# Patient Record
Sex: Female | Born: 1995 | Race: Black or African American | Hispanic: No | Marital: Single | Smoking: Never smoker
Health system: Southern US, Community
[De-identification: ages and names within clinical notes are randomized; demographics above are authoritative.]

## PROBLEM LIST (undated history)

## (undated) DIAGNOSIS — T7840XA Allergy, unspecified, initial encounter: Secondary | ICD-10-CM

## (undated) HISTORY — DX: Allergy, unspecified, initial encounter: T78.40XA

---

## 2018-04-18 ENCOUNTER — Other Ambulatory Visit: Payer: Self-pay

## 2018-04-18 ENCOUNTER — Encounter: Payer: Self-pay | Admitting: Emergency Medicine

## 2018-04-18 DIAGNOSIS — R509 Fever, unspecified: Secondary | ICD-10-CM | POA: Diagnosis present

## 2018-04-18 DIAGNOSIS — R110 Nausea: Secondary | ICD-10-CM | POA: Insufficient documentation

## 2018-04-18 DIAGNOSIS — R5383 Other fatigue: Secondary | ICD-10-CM | POA: Diagnosis not present

## 2018-04-18 DIAGNOSIS — R42 Dizziness and giddiness: Secondary | ICD-10-CM | POA: Insufficient documentation

## 2018-04-18 DIAGNOSIS — R5381 Other malaise: Secondary | ICD-10-CM | POA: Diagnosis not present

## 2018-04-18 DIAGNOSIS — R21 Rash and other nonspecific skin eruption: Secondary | ICD-10-CM | POA: Insufficient documentation

## 2018-04-18 DIAGNOSIS — R197 Diarrhea, unspecified: Secondary | ICD-10-CM | POA: Insufficient documentation

## 2018-04-18 DIAGNOSIS — D709 Neutropenia, unspecified: Secondary | ICD-10-CM | POA: Diagnosis not present

## 2018-04-18 LAB — URINALYSIS, COMPLETE (UACMP) WITH MICROSCOPIC
BILIRUBIN URINE: NEGATIVE
Glucose, UA: NEGATIVE mg/dL
Hgb urine dipstick: NEGATIVE
Ketones, ur: NEGATIVE mg/dL
LEUKOCYTES UA: NEGATIVE
Nitrite: NEGATIVE
PH: 6 (ref 5.0–8.0)
Protein, ur: NEGATIVE mg/dL
Specific Gravity, Urine: 1.009 (ref 1.005–1.030)

## 2018-04-18 LAB — CBC WITH DIFFERENTIAL/PLATELET
Basophils Absolute: 0 10*3/uL (ref 0–0.1)
Basophils Relative: 1 %
EOS ABS: 0.1 10*3/uL (ref 0–0.7)
Eosinophils Relative: 4 %
HCT: 34.5 % — ABNORMAL LOW (ref 35.0–47.0)
Hemoglobin: 11.7 g/dL — ABNORMAL LOW (ref 12.0–16.0)
LYMPHS ABS: 0.8 10*3/uL — AB (ref 1.0–3.6)
Lymphocytes Relative: 44 %
MCH: 30.4 pg (ref 26.0–34.0)
MCHC: 34 g/dL (ref 32.0–36.0)
MCV: 89.6 fL (ref 80.0–100.0)
MONO ABS: 0.3 10*3/uL (ref 0.2–0.9)
Monocytes Relative: 17 %
Neutro Abs: 0.6 10*3/uL — ABNORMAL LOW (ref 1.4–6.5)
Neutrophils Relative %: 34 %
PLATELETS: 196 10*3/uL (ref 150–440)
RBC: 3.85 MIL/uL (ref 3.80–5.20)
RDW: 13.5 % (ref 11.5–14.5)
WBC: 1.9 10*3/uL — AB (ref 3.6–11.0)

## 2018-04-18 LAB — COMPREHENSIVE METABOLIC PANEL
ALT: 45 U/L (ref 14–54)
AST: 44 U/L — AB (ref 15–41)
Albumin: 3.8 g/dL (ref 3.5–5.0)
Alkaline Phosphatase: 45 U/L (ref 38–126)
Anion gap: 9 (ref 5–15)
BUN: 7 mg/dL (ref 6–20)
CHLORIDE: 105 mmol/L (ref 101–111)
CO2: 23 mmol/L (ref 22–32)
CREATININE: 0.59 mg/dL (ref 0.44–1.00)
Calcium: 8.5 mg/dL — ABNORMAL LOW (ref 8.9–10.3)
GFR calc Af Amer: >60 mL/min (ref >60)
GFR calc non Af Amer: >60 mL/min (ref >60)
Glucose, Bld: 119 mg/dL — ABNORMAL HIGH (ref 65–99)
Potassium: 3.6 mmol/L (ref 3.5–5.1)
SODIUM: 137 mmol/L (ref 135–145)
Total Bilirubin: 0.5 mg/dL (ref 0.3–1.2)
Total Protein: 6.8 g/dL (ref 6.5–8.1)

## 2018-04-18 LAB — LIPASE, BLOOD: LIPASE: 34 U/L (ref 11–51)

## 2018-04-18 LAB — TROPONIN I: Troponin I: <0.03 ng/mL (ref <0.03)

## 2018-04-18 LAB — POCT PREGNANCY, URINE: Preg Test, Ur: NEGATIVE

## 2018-04-18 NOTE — ED Triage Notes (Addendum)
Pt to triage via w/c, tearful, appears anxious; pt with multiple c/o; st began as possible allergic rx to greek yogurt on Thurs or Fri; st she knows because she "googled it" and had all the symptoms (diarrhea, hives, itching, throat pain, lower abd pain); pt reports dizziness persists and a "soreness" to the right side of her neck when she presses on it

## 2018-04-19 ENCOUNTER — Emergency Department: Payer: Managed Care, Other (non HMO)

## 2018-04-19 ENCOUNTER — Emergency Department
Admission: EM | Admit: 2018-04-19 | Discharge: 2018-04-19 | Disposition: A | Discharge status: Home / Self Care | Payer: Managed Care, Other (non HMO) | Attending: Emergency Medicine | Admitting: Emergency Medicine

## 2018-04-19 DIAGNOSIS — R5381 Other malaise: Secondary | ICD-10-CM

## 2018-04-19 DIAGNOSIS — R5383 Other fatigue: Secondary | ICD-10-CM

## 2018-04-19 DIAGNOSIS — D709 Neutropenia, unspecified: Secondary | ICD-10-CM

## 2018-04-19 MED ORDER — DOXYCYCLINE HYCLATE 100 MG PO CAPS: ORAL_CAPSULE | ORAL | 0 refills | Status: AC

## 2018-04-19 NOTE — Discharge Instructions (Addendum)
As we discussed, your symptoms and low white blood cell count most likely are the result of a viral illness or possibly a tickborne infection.  There is no reason to think at this point you have any other or worse medical condition.  It is important, however, that you follow-up as an outpatient with a hematologist who will likely do some repeat blood work and possibly some additional testing to make sure that your blood count is returning to normal.  Please read through the included information for additional details about neutropenia.    As we also discussed, we are sending blood cultures and tests for various tickborne illnesses, but we are going to go ahead and treat you for the possibility of tickborne illness with antibiotics called doxycycline.  Please take the antibiotics twice daily until the medication is gone, and less Dr. Donneta RombergBrahmanday or one of his colleagues tells you next week when you follow-up that you should stop taking them.  In the meantime drink plenty of fluids to stay hydrated and get as much rest as you can.  If you develop any new or worsening symptoms that concern you, including but not limited to fever of 100.4 or greater, persistent vomiting, severe pain, etc., please return immediately to the emergency department.

## 2018-04-19 NOTE — ED Provider Notes (Addendum)
[facility] Emergency Department Provider Note  ____________________________________________   First MD Initiated Contact with Patient 04/19/18 0132     (approximate)  I have reviewed the triage vital signs and the nursing notes.   HISTORY  Chief Complaint No chief complaint on file.    HPI Wendy Carreto is a 22 y.o. female with no chronic medical issues who presents for evaluation of about 5 days of generalized symptoms that includes fever and general malaise, body aches, some nausea, several episodes of loose stools, and nonspecific rash that she believes is hives, and some discomfort inside of her neck.  She had some dizziness or lightheadedness over the last couple of days as well.  She believes that the symptoms started after eating some Greek yogurt about 5 days ago just prior to the onset of the symptoms.  Benadryl made the rash go away and stop itching, but nothing else in particular has made her feel better or worse.  She denies chest pain, shortness of breath, cough, neck pain, neck stiffness.  She has had some subjective fever but she has not measured her temperature.  No chills/rigors.  No abdominal pain nor dysuria.  She describes the symptoms moderate and worse.  History reviewed. No pertinent past medical history.  There are no active problems to display for this patient.   History reviewed. No pertinent surgical history.  Prior to Admission medications   Medication Sig Start Date End Date Taking? Authorizing Provider  doxycycline (VIBRAMYCIN) 100 MG capsule Take 1 capsule (100 mg) by mouth twice daily for 10 days. 04/19/18   Loleta Rose, MD    Allergies Patient has no known allergies.  No family history on file.  Social History Social History   Tobacco Use  . Smoking status: Never Smoker  . Smokeless tobacco: Never Used  Substance Use Topics  . Alcohol use: Not on file  . Drug use: Not on file    Review of  Systems Constitutional: Subjective fever. General malaise/fatigue. Eyes: No visual changes. ENT: No sore throat. Some tenderness in the right side of her neck, no swelling. Cardiovascular: Denies chest pain. Respiratory: Denies shortness of breath. Gastrointestinal: No abdominal pain.  Nausea, no vomiting.  Some mild diarrhea.  No constipation. Genitourinary: Negative for dysuria. Musculoskeletal: Mild right sided neck tenderness, no swelling.  Negative for back pain. Integumentary: Brief episode of generalized rash all over body that was red and raised and pruritic but which resolved after Benadryl Neurological: Negative for headaches, focal weakness or numbness.  Lightheadedness.   ____________________________________________   PHYSICAL EXAM:  VITAL SIGNS: ED Triage Vitals  Enc Vitals Group     BP 04/18/18 2108 137/69     Pulse Rate 04/18/18 2108 90     Resp 04/18/18 2108 20     Temp 04/18/18 2108 98.2 F (36.8 C)     Temp Source 04/18/18 2108 Oral     SpO2 04/18/18 2108 100 %     Weight 04/18/18 2106 56.7 kg (125 lb)     Height 04/18/18 2106 1.651 m (5\' 5" )     Head Circumference --      Peak Flow --      Pain Score 04/18/18 2106 0     Pain Loc --      Pain Edu? --      Excl. in GC? --     Constitutional: Alert and oriented. Well appearing and in no acute distress. Eyes: Conjunctivae are normal.  Head: Atraumatic. Nose:  No congestion/rhinnorhea. Mouth/Throat: Mucous membranes are moist.  Oropharynx non-erythematous. Neck: No stridor.  No meningeal signs.  No palpable lymphadenopathy nor induration. Cardiovascular: Normal rate, regular rhythm. Good peripheral circulation. Grossly normal heart sounds. Respiratory: Normal respiratory effort.  No retractions. Lungs CTAB. Gastrointestinal: Soft and nontender. No distention.  Musculoskeletal: No lower extremity tenderness nor edema. No gross deformities of extremities. Neurologic:  Normal speech and language. No gross  focal neurologic deficits are appreciated.  Skin:  Skin is warm, dry and intact. No rash noted. Psychiatric: Mood and affect are Anxious and at times tearful, but not inappropriate. Speech and behavior are otherwise normal.  ____________________________________________   LABS (all labs ordered are listed, but only abnormal results are displayed)  Labs Reviewed  COMPREHENSIVE METABOLIC PANEL - Abnormal; Notable for the following components:      Result Value   Glucose, Bld 119 (*)    Calcium 8.5 (*)    AST 44 (*)    All other components within normal limits  URINALYSIS, COMPLETE (UACMP) WITH MICROSCOPIC - Abnormal; Notable for the following components:   Color, Urine YELLOW (*)    APPearance CLEAR (*)    Bacteria, UA RARE (*)    All other components within normal limits  CBC WITH DIFFERENTIAL/PLATELET - Abnormal; Notable for the following components:   WBC 1.9 (*)    Hemoglobin 11.7 (*)    HCT 34.5 (*)    Neutro Abs 0.6 (*)    Lymphs Abs 0.8 (*)    All other components within normal limits  CULTURE, BLOOD (ROUTINE X 2)  CULTURE, BLOOD (ROUTINE X 2)  TROPONIN I  LIPASE, BLOOD  CBC WITH DIFFERENTIAL/PLATELET  ROCKY MTN SPOTTED FVR ABS PNL(IGG+IGM)  B. BURGDORFI ANTIBODIES  POCT PREGNANCY, URINE   ____________________________________________  EKG  ED ECG REPORT I, Loleta Roseory Glennda Weatherholtz, the attending physician, personally viewed and interpreted this ECG.  Date: 04/18/2018 EKG Time: 21: 09 Rate: 91 Rhythm: normal sinus rhythm with mild sinus arrhythmia QRS Axis: normal Intervals: normal ST/T Wave abnormalities: normal Narrative Interpretation: no evidence of acute ischemia  ____________________________________________  RADIOLOGY I, Loleta Roseory Rocio Wolak, personally viewed and evaluated these images (plain radiographs) as part of my medical decision making, as well as reviewing the written report by the radiologist.   ED MD interpretation:  Question bronchitis, no evidence of  pneumonia according to radiology.  No contraindication to Doxy which may actually help, no evidence of lobar pneumonia or mass  Official radiology report(s): Dg Chest 2 View  Result Date: 04/19/2018 CLINICAL DATA:  Neutropenia.  Subjective fever. EXAM: CHEST - 2 VIEW COMPARISON:  None. FINDINGS: The cardiomediastinal contours are normal. Mild bronchial thickening. Pulmonary vasculature is normal. No consolidation, pleural effusion, or pneumothorax. No acute osseous abnormalities are seen. IMPRESSION: Mild bronchial thickening which may reflect bronchitis. No pneumonia. Electronically Signed   By: Rubye OaksMelanie  Ehinger M.D.   On: 04/19/2018 02:58    ____________________________________________   PROCEDURES  Critical Care performed: No   Procedure(s) performed:   Procedures   ____________________________________________   INITIAL IMPRESSION / ASSESSMENT AND PLAN / ED COURSE  As part of my medical decision making, I reviewed the following data within the electronic MEDICAL RECORD NUMBER Nursing notes reviewed and incorporated, Labs reviewed  and A phone consult was requested and obtained from this/these consultant(s) (heme/onc).    Differential diagnosis includes, but is not limited to, viral illness, tickborne illness, nonspecific infection, oncological issues such as leukemia or lymphoma.  However the patient is very well-appearing with normal  vital signs, afebrile, and her only lab abnormality after full review of her lab work is the leukopenia/neutropenia.  I verified with the lab that she has no visible bands and that her ANC is 646 (or 0.6 as it is reported).  I called and spoke with phone with Dr. Donneta Romberg.  We discussed the patient in the differential diagnosis.  I asked if there is any benefit or if he would recommend admitting the patient to the hospital and given that she is well-appearing with no evidence of sepsis or active infection and is afebrile, he felt that following up as an  outpatient would be a opiate.  I read the possibility of empiric doxycycline given the chance of tickborne illness; she has leukopenia and a very slightly elevated AST but no thrombocytopenia.  He feels that this is appropriate treatment at this time.  I am checking a chest x-ray to look for any evidence of pneumonia or other acute thoracic abnormality, sending blood cultures, and sending both Lyme disease and RMSF tests.    I explained to the patient the differential and that we think it is most likely viral, why we are giving the antibiotics, and plan is to follow-up.  I also gave her return options including but not limited to fever.  She understands and agrees with the plan.  Clinical Course as of Apr 19 306  Wed Apr 19, 2018  0305 Question bronchitis, no evidence of pneumonia according to radiology.  No contraindication to Doxy which may actually help, no evidence of lobar pneumonia or mass.  DG Chest 2 View [CF]    Clinical Course User Index [CF] Loleta Rose, MD    ____________________________________________  FINAL CLINICAL IMPRESSION(S) / ED DIAGNOSES  Final diagnoses:  Neutropenia, unspecified type (HCC)  Malaise and fatigue     MEDICATIONS GIVEN DURING THIS VISIT:  Medications - No data to display   ED Discharge Orders        Ordered    doxycycline (VIBRAMYCIN) 100 MG capsule     04/19/18 0305       Note:  This document was prepared using Dragon voice recognition software and may include unintentional dictation errors.       Loleta Rose, MD 04/19/18 [phone]

## 2018-04-21 LAB — B. BURGDORFI ANTIBODIES: B burgdorferi Ab IgG+IgM: <0.91 {ISR} (ref 0.00–0.90)

## 2018-04-21 LAB — ROCKY MTN SPOTTED FVR ABS PNL(IGG+IGM)
RMSF IGG: NEGATIVE
RMSF IgM: 0.46 index (ref 0.00–0.89)

## 2018-04-24 LAB — CULTURE, BLOOD (ROUTINE X 2)
Culture: NO GROWTH
Culture: NO GROWTH
Special Requests: ADEQUATE

## 2018-04-25 ENCOUNTER — Encounter: Payer: Self-pay | Admitting: Oncology

## 2018-04-25 ENCOUNTER — Inpatient Hospital Stay: Payer: Managed Care, Other (non HMO)

## 2018-04-25 ENCOUNTER — Inpatient Hospital Stay: Payer: Managed Care, Other (non HMO) | Attending: Oncology | Admitting: Oncology

## 2018-04-25 VITALS — BP 98/65 | HR 75 | Temp 97.5°F | Resp 16 | Wt 123.0 lb

## 2018-04-25 DIAGNOSIS — D72810 Lymphocytopenia: Secondary | ICD-10-CM | POA: Diagnosis not present

## 2018-04-25 DIAGNOSIS — D709 Neutropenia, unspecified: Secondary | ICD-10-CM | POA: Insufficient documentation

## 2018-04-25 LAB — CBC WITH DIFFERENTIAL/PLATELET
Basophils Absolute: 0 10*3/uL (ref 0–0.1)
Basophils Relative: 1 %
EOS PCT: 3 %
Eosinophils Absolute: 0.1 10*3/uL (ref 0–0.7)
HCT: 36.3 % (ref 35.0–47.0)
Hemoglobin: 12.3 g/dL (ref 12.0–16.0)
LYMPHS ABS: 1.5 10*3/uL (ref 1.0–3.6)
LYMPHS PCT: 38 %
MCH: 31.2 pg (ref 26.0–34.0)
MCHC: 34 g/dL (ref 32.0–36.0)
MCV: 91.9 fL (ref 80.0–100.0)
MONOS PCT: 12 %
Monocytes Absolute: 0.5 10*3/uL (ref 0.2–0.9)
Neutro Abs: 1.8 10*3/uL (ref 1.4–6.5)
Neutrophils Relative %: 46 %
PLATELETS: 473 10*3/uL — AB (ref 150–440)
RBC: 3.95 MIL/uL (ref 3.80–5.20)
RDW: 13.5 % (ref 11.5–14.5)
WBC: 3.9 10*3/uL (ref 3.6–11.0)

## 2018-04-25 LAB — FOLATE: FOLATE: 12.9 ng/mL (ref >5.9)

## 2018-04-25 LAB — IRON AND TIBC
IRON: 37 ug/dL (ref 28–170)
SATURATION RATIOS: 11 % (ref 10.4–31.8)
TIBC: 353 ug/dL (ref 250–450)
UIBC: 316 ug/dL

## 2018-04-25 LAB — LACTATE DEHYDROGENASE: LDH: 219 U/L — ABNORMAL HIGH (ref 98–192)

## 2018-04-25 LAB — VITAMIN B12: Vitamin B-12: 471 pg/mL (ref 180–914)

## 2018-04-25 LAB — FERRITIN: FERRITIN: 46 ng/mL (ref 11–307)

## 2018-04-25 NOTE — Progress Notes (Addendum)
Hematology/Oncology Consult note [facility] [phone] [phone]   Patient Care Team: System, Pcp Not In as PCP - General  REFERRING PROVIDER: Emergency room CHIEF COMPLAINTS/REASON FOR VISIT:  Evaluation of neutropenia  HISTORY OF PRESENTING ILLNESS:  Wendy Melendez is a  22 y.o.  female with PMH listed below who was referred to me for evaluation of neutropenia.  Patient has no chronic medications presents to emergency room for about 5 days of generalized symptoms that include fever and generalized body aches, nausea, loose stools and nonspecific rash.  She felt dizziness.  She took Benadryl which improved the rash and stop itching.  Lab work-up showed neutropenia at 0.6, lymphocytopenia 0.8.  Total white count 1.9.  Hemoglobin 11.7. It was thought that patient may have a tickborne disease the patient was discharged with empiric doxycycline.  Patient reports she is feeling better.  Denies any episode of fever or chills, additional episodes of nausea vomiting diarrhea.  Work-up for tickborne disease is negative  Review of Systems  Constitutional: Negative for chills, fever, malaise/fatigue and weight loss.  HENT: Negative for congestion, ear discharge, ear pain, nosebleeds, sinus pain and sore throat.   Eyes: Negative for double vision, photophobia, pain, discharge and redness.  Respiratory: Negative for cough, hemoptysis, sputum production, shortness of breath and wheezing.   Cardiovascular: Negative for chest pain, palpitations, orthopnea, claudication and leg swelling.  Gastrointestinal: Negative for abdominal pain, blood in stool, constipation, diarrhea, heartburn, melena, nausea and vomiting.  Genitourinary: Negative for dysuria, flank pain, frequency and hematuria.  Musculoskeletal: Negative for back pain, myalgias and neck pain.  Skin: Negative for itching and rash.  Neurological: Negative for dizziness, tingling, tremors, focal  weakness, weakness and headaches.  Endo/Heme/Allergies: Negative for environmental allergies. Does not bruise/bleed easily.  Psychiatric/Behavioral: Negative for depression and hallucinations. The patient is not nervous/anxious.     MEDICAL HISTORY:  Past Medical History:  Diagnosis Date  . Allergy     SURGICAL HISTORY: History reviewed. No pertinent surgical history.  SOCIAL HISTORY: Social History   Socioeconomic History  . Marital status: Single    Spouse name: Not on file  . Number of children: Not on file  . Years of education: Not on file  . Highest education level: Not on file  Occupational History  . Not on file  Social Needs  . Financial resource strain: Not on file  . Food insecurity:    Worry: Not on file    Inability: Not on file  . Transportation needs:    Medical: Not on file    Non-medical: Not on file  Tobacco Use  . Smoking status: Never Smoker  . Smokeless tobacco: Never Used  Substance and Sexual Activity  . Alcohol use: Never    Frequency: Never  . Drug use: Yes    Types: Marijuana  . Sexual activity: Not on file  Lifestyle  . Physical activity:    Days per week: Not on file    Minutes per session: Not on file  . Stress: Not on file  Relationships  . Social connections:    Talks on phone: Not on file    Gets together: Not on file    Attends religious service: Not on file    Active member of club or organization: Not on file    Attends meetings of clubs or organizations: Not on file    Relationship status: Not on file  . Intimate partner violence:    Fear of current or ex  partner: Not on file    Emotionally abused: Not on file    Physically abused: Not on file    Forced sexual activity: Not on file  Other Topics Concern  . Not on file  Social History Narrative  . Not on file    FAMILY HISTORY: History reviewed. No pertinent family history.  ALLERGIES:  has No Known Allergies.  MEDICATIONS:  Current Outpatient Medications    Medication Sig Dispense Refill  . doxycycline (VIBRAMYCIN) 100 MG capsule Take 1 capsule (100 mg) by mouth twice daily for 10 days. 20 capsule 0  . montelukast (SINGULAIR) 10 MG tablet Take 10 mg by mouth at bedtime.     No current facility-administered medications for this visit.      PHYSICAL EXAMINATION: ECOG PERFORMANCE STATUS: 0 - Asymptomatic Vitals:   04/25/18 1505  BP: 98/65  Pulse: 75  Resp: 16  Temp: (!) 97.5 F (36.4 C)   Filed Weights   04/25/18 1505  Weight: 123 lb (55.8 kg)    Physical Exam  Constitutional: She is oriented to person, place, and time. She appears well-developed and well-nourished. No distress.  HENT:  Head: Normocephalic and atraumatic.  Right Ear: External ear normal.  Left Ear: External ear normal.  Mouth/Throat: Oropharynx is clear and moist.  Eyes: Pupils are equal, round, and reactive to light. Conjunctivae and EOM are normal. No scleral icterus.  Neck: Normal range of motion. Neck supple.  Cardiovascular: Normal rate, regular rhythm and normal heart sounds.  Pulmonary/Chest: Effort normal and breath sounds normal. No respiratory distress. She has no wheezes. She has no rales. She exhibits no tenderness.  Abdominal: Soft. Bowel sounds are normal. She exhibits no distension and no mass. There is no tenderness.  Musculoskeletal: Normal range of motion. She exhibits no edema or deformity.  Lymphadenopathy:    She has no cervical adenopathy.  Neurological: She is alert and oriented to person, place, and time. No cranial nerve deficit. Coordination normal.  Skin: Skin is warm and dry. No rash noted.  Psychiatric: She has a normal mood and affect. Her behavior is normal. Thought content normal.     LABORATORY DATA:  I have reviewed the data as listed Lab Results  Component Value Date   WBC 3.9 04/25/2018   HGB 12.3 04/25/2018   HCT 36.3 04/25/2018   MCV 91.9 04/25/2018   PLT 473 (H) 04/25/2018   Recent Labs    04/18/18 2111  NA  137  K 3.6  CL 105  CO2 23  GLUCOSE 119*  BUN 7  CREATININE 0.59  CALCIUM 8.5*  GFRNONAA >60  GFRAA >60  PROT 6.8  ALBUMIN 3.8  AST 44*  ALT 45  ALKPHOS 45  BILITOT 0.5   Iron/TIBC/Ferritin/ %Sat    Component Value Date/Time   IRON 37 04/25/2018 1557   TIBC 353 04/25/2018 1557   FERRITIN 46 04/25/2018 1557   IRONPCTSAT 11 04/25/2018 1557        ASSESSMENT & PLAN:  1. Neutropenia, unspecified type (HCC)   2. Lymphocytopenia   Labs reviewed.  Chest x-ray was independently reviewed which showed mild bronchial thickening which may reflect bronchitis.  No pneumonia. Discussed with patient that given her acute onset of symptoms, it is likely that low counts can be reactive to a viral infection.  We do not have any baseline CBC to compare with.  I recommend checking basic work-up for neutropenia including repeat CBC, LDH, B12, HIV, folate, hepatitis panel, iron, TIBC and ferritin. She  voices understanding and agree with the plan.  #Lab work-up reviewed.  Both neutropenia and lymphocytopenia have resolved.  Hemoglobin also normalized.  Likely reactive to acute infection.  #Thrombocytosis: Her lab work-up showed mild thrombocytosis.  Likely reactive to acute infection.  Recommend follow-up CBC in 4-35months.  Orders Placed This Encounter  Procedures  . CBC with Differential/Platelet    Standing Status:   Future    Number of Occurrences:   1    Standing Expiration Date:   04/26/2019  . Technologist smear review    Standing Status:   Future    Number of Occurrences:   1    Standing Expiration Date:   04/26/2019  . Lactate dehydrogenase    Standing Status:   Future    Number of Occurrences:   1    Standing Expiration Date:   04/26/2019  . Vitamin B12    Standing Status:   Future    Number of Occurrences:   1    Standing Expiration Date:   04/26/2019  . Folate    Standing Status:   Future    Number of Occurrences:   1    Standing Expiration Date:   04/26/2019  . HIV  antibody    Standing Status:   Future    Number of Occurrences:   1    Standing Expiration Date:   04/26/2019  . Hepatitis panel, acute    Standing Status:   Future    Number of Occurrences:   1    Standing Expiration Date:   04/26/2019  . Iron and TIBC    Standing Status:   Future    Number of Occurrences:   1    Standing Expiration Date:   04/26/2019  . Ferritin    Standing Status:   Future    Number of Occurrences:   1    Standing Expiration Date:   04/26/2019    All questions were answered. The patient knows to call the clinic with any problems questions or concerns.  Return of visit:  Thank you for this kind referral and the opportunity to participate in the care of this patient. A copy of today's note is routed to referring provider  Total face to face encounter time for this patient visit was 40 min. >50% of the time was  spent in counseling and coordination of care.    Rickard Patience, MD, PhD Hematology Oncology [facility] Pager- [phone] 04/25/2018

## 2018-04-26 ENCOUNTER — Telehealth: Payer: Self-pay | Admitting: *Deleted

## 2018-04-26 LAB — HEPATITIS PANEL, ACUTE
Hep A IgM: NEGATIVE
Hep B C IgM: NEGATIVE
Hepatitis B Surface Ag: NEGATIVE

## 2018-04-26 LAB — HIV ANTIBODY (ROUTINE TESTING W REFLEX): HIV Screen 4th Generation wRfx: NONREACTIVE

## 2018-04-26 NOTE — Telephone Encounter (Signed)
Patient called asking for results of labs yesterday, Please return call [phone]

## 2018-04-26 NOTE — Telephone Encounter (Signed)
Per Dr Cathie HoopsYu, okay to cancel follow up appt, however she should follow up with pcp with lab work to monitor blood work.  Patient verbalized understanding and request to cancel up coming appt

## 2018-04-27 LAB — TECHNOLOGIST SMEAR REVIEW

## 2018-05-15 ENCOUNTER — Ambulatory Visit: Payer: Managed Care, Other (non HMO) | Admitting: Oncology

## 2019-01-22 IMAGING — CR DG CHEST 2V
1 series · 2 of 2 positions shown · non-contrast
Comparison: None.

CLINICAL DATA: Neutropenia.  Subjective fever.

EXAM:
CHEST - 2 VIEW

[Series 1: dg chest 2 view · 0.14mm/px · 2 of 2 slices shown]
[im 1/2]
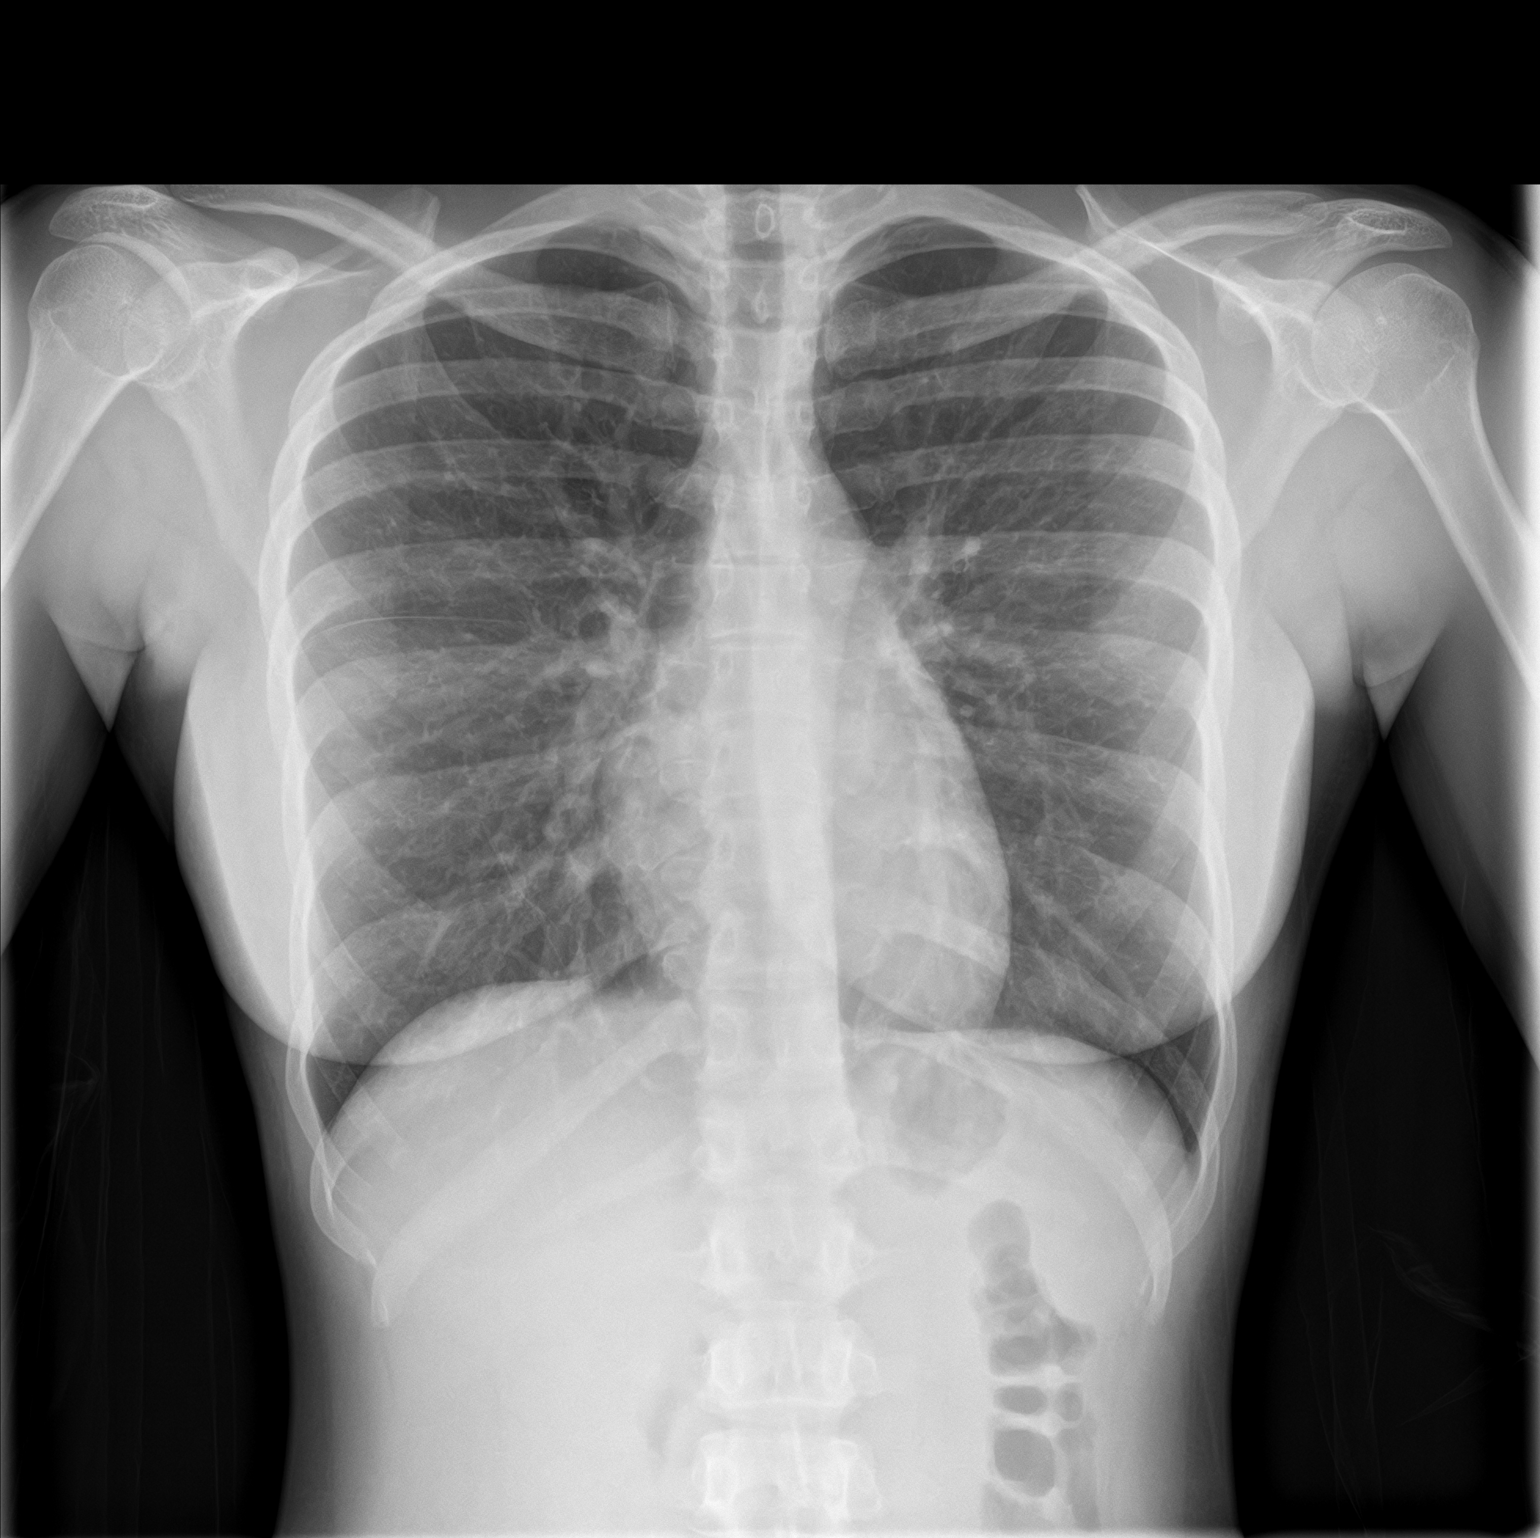
[im 2/2]
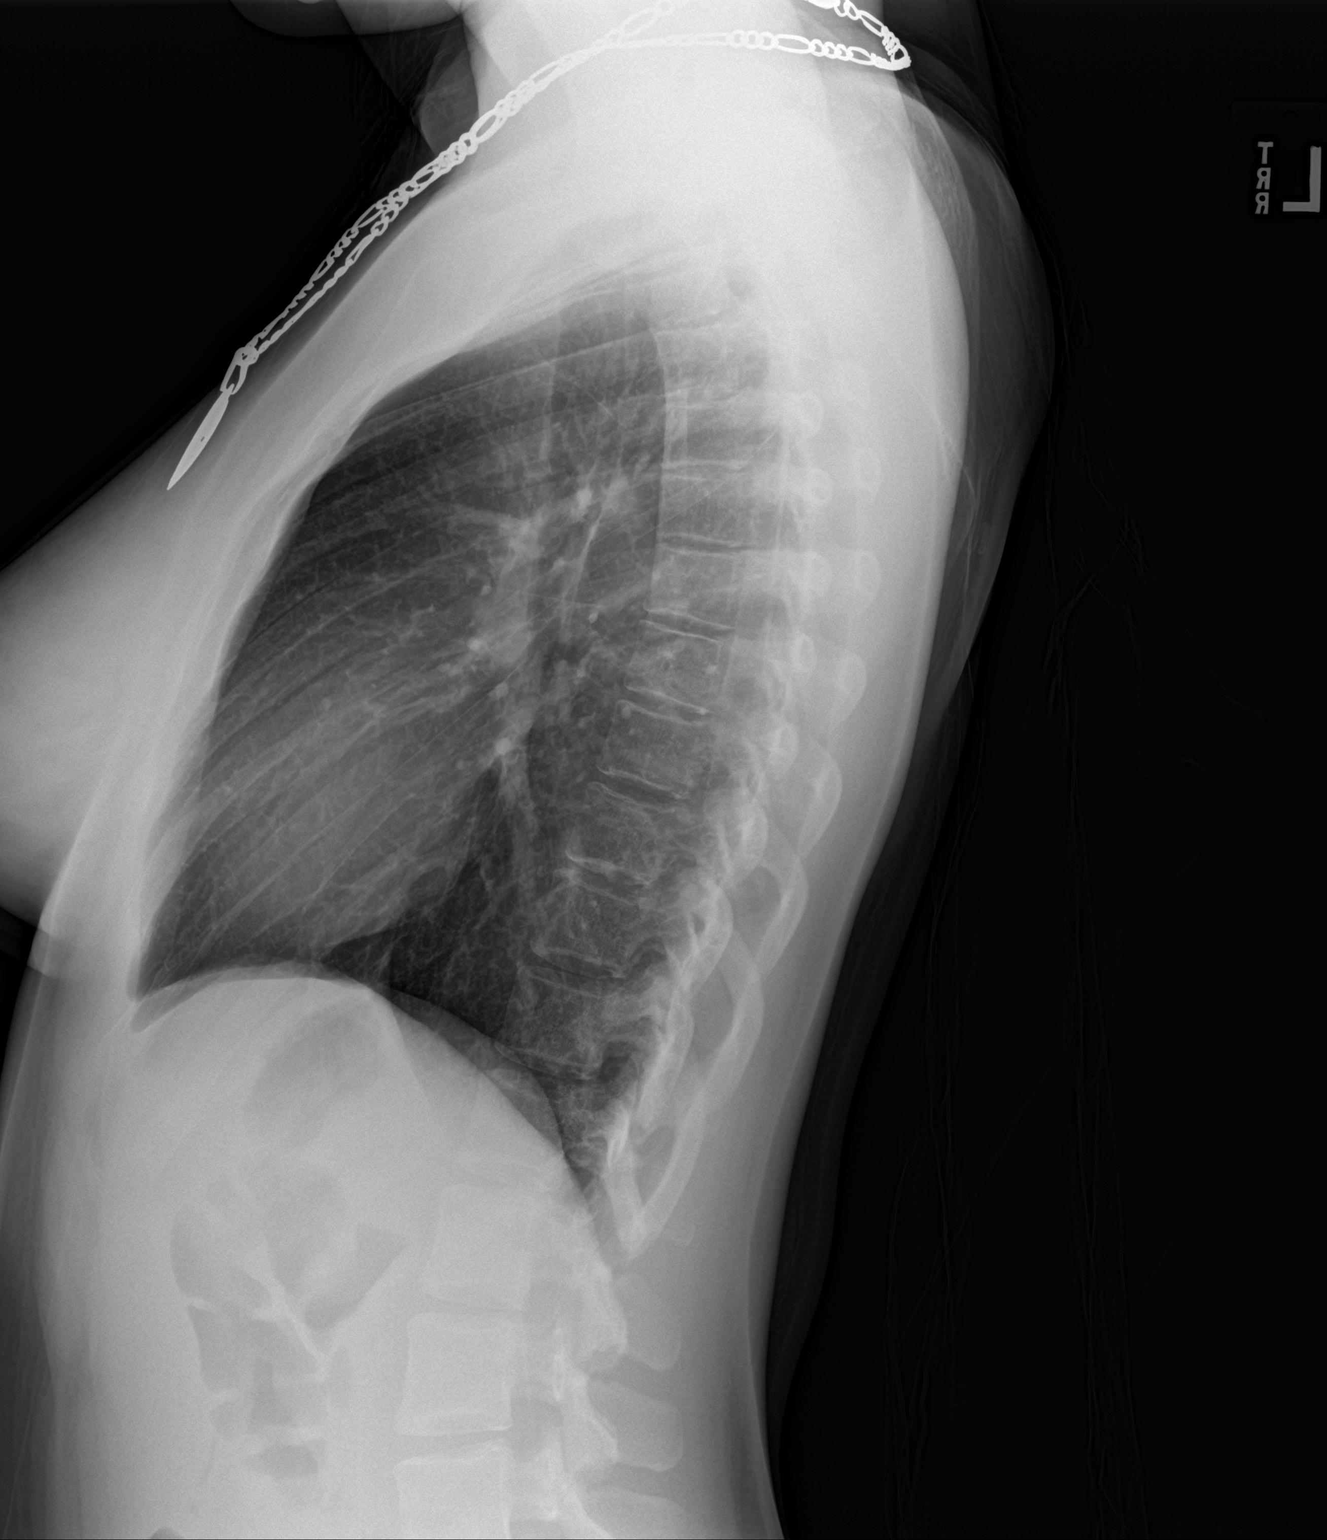

[2 of 2 positions shown; findings below may reference images not displayed]

FINDINGS: The cardiomediastinal contours are normal. Mild bronchial
thickening. Pulmonary vasculature is normal. No consolidation,
pleural effusion, or pneumothorax. No acute osseous abnormalities
are seen.
IMPRESSION: Mild bronchial thickening which may reflect bronchitis. No
pneumonia.

## 2019-08-01 ENCOUNTER — Ambulatory Visit (INDEPENDENT_AMBULATORY_CARE_PROVIDER_SITE_OTHER): Payer: Commercial Managed Care - POS | Admitting: Family Medicine

## 2019-08-01 ENCOUNTER — Encounter (INDEPENDENT_AMBULATORY_CARE_PROVIDER_SITE_OTHER): Payer: Self-pay | Admitting: Family Medicine

## 2019-08-01 VITALS — BP 112/73 | HR 76 | Temp 98.7°F | Ht 65.5 in | Wt 140.6 lb

## 2019-08-01 DIAGNOSIS — Z23 Encounter for immunization: Secondary | ICD-10-CM

## 2019-08-01 DIAGNOSIS — Z124 Encounter for screening for malignant neoplasm of cervix: Secondary | ICD-10-CM

## 2019-08-01 DIAGNOSIS — Z113 Encounter for screening for infections with a predominantly sexual mode of transmission: Secondary | ICD-10-CM

## 2019-08-01 DIAGNOSIS — Z0000 Encounter for general adult medical examination without abnormal findings: Secondary | ICD-10-CM

## 2019-08-01 DIAGNOSIS — J302 Other seasonal allergic rhinitis: Secondary | ICD-10-CM

## 2019-08-01 LAB — HIV AG/AB 4TH GENERATION: HIV Ag/Ab, 4th Generation: NONREACTIVE

## 2019-08-01 MED ORDER — MONTELUKAST SODIUM 10 MG PO TABS
10.00 mg | ORAL_TABLET | Freq: Every evening | ORAL | 5 refills | Status: AC
Start: 2019-08-01 — End: 2020-01-28

## 2019-08-01 MED ORDER — ALBUTEROL SULFATE HFA 108 (90 BASE) MCG/ACT IN AERS
2.0000 | INHALATION_SPRAY | RESPIRATORY_TRACT | 5 refills | Status: DC | PRN
Start: 2019-08-01 — End: 2020-03-27

## 2019-08-01 NOTE — Progress Notes (Signed)
Subjective:      Patient ID: Melissa Bernard is a 23 y.o. female.    Chief Complaint:  Chief Complaint   Patient presents with   . Establish Care     fasting   . Annual Exam       HPI  Visit Type: Health Maintenance Visit. No acute complaints today. She resently moved to the area for work.       Problem List:  There is no problem list on file for this patient.      Current Medications:  Outpatient Medications Marked as Taking for the 08/01/19 encounter (Office Visit) with Elray Buba B, DO   Medication Sig Dispense Refill   . [DISCONTINUED] cetirizine (ZyrTEC) 10 MG tablet Take 10 mg by mouth daily         Allergies:  Allergies   Allergen Reactions   . Pollen Extract        Past Medical History:  History reviewed. No pertinent past medical history.    Past Surgical History:  History reviewed. No pertinent surgical history.    Family History:  Family History   Problem Relation Age of Onset   . Kidney disease Mother    . Hyperlipidemia Maternal Grandmother    . Hypertension Maternal Grandmother        Social History:  Social History     Tobacco Use   . Smoking status: Never Smoker   . Smokeless tobacco: Never Used   Substance Use Topics   . Alcohol use: Yes   . Drug use: Yes     Types: Marijuana     Comment: weekly         The following sections were reviewed this encounter by the provider:   Tobacco  Med Hx  Surg Hx  Soc Hx          Review of Systems   Constitutional: Negative.  Negative for activity change, chills, fatigue, fever and unexpected weight change.   HENT: Negative for congestion, drooling, ear pain, sinus pressure and sore throat.    Eyes: Negative for redness.   Respiratory: Negative for cough, choking, chest tightness and shortness of breath.    Cardiovascular: Negative for chest pain, palpitations and leg swelling.   Gastrointestinal: Negative for abdominal pain, blood in stool, constipation, diarrhea, nausea and vomiting.   Endocrine: Negative for cold intolerance, polydipsia, polyphagia  and polyuria.   Genitourinary: Negative for difficulty urinating, dysuria, flank pain, frequency and hematuria.   Musculoskeletal: Negative for arthralgias and myalgias.   Skin: Negative for color change.   Neurological: Negative for seizures, syncope, light-headedness, numbness and headaches.   Psychiatric/Behavioral: Negative for confusion, sleep disturbance and suicidal ideas. The patient is not hyperactive.    All other systems reviewed and are negative.       Objective:   Vitals:  BP 112/73 (BP Site: Left arm, Patient Position: Sitting)   Pulse 76   Temp 98.7 F (37.1 C) (Temporal)   Ht 1.664 m (5' 5.5")   Wt 63.8 kg (140 lb 9.6 oz)   LMP 07/10/2019 (Exact Date)   BMI 23.04 kg/m     Physical Exam  Vitals signs and nursing note reviewed.   Constitutional:       General: She is not in acute distress.     Appearance: Normal appearance.   HENT:      Head: Normocephalic and atraumatic.      Nose: No congestion.      Mouth/Throat:  Mouth: Mucous membranes are moist.   Eyes:      Pupils: Pupils are equal, round, and reactive to light.   Neck:      Musculoskeletal: Neck supple.   Cardiovascular:      Rate and Rhythm: Normal rate and regular rhythm.      Heart sounds: No murmur. No friction rub.   Pulmonary:      Breath sounds: Normal breath sounds. No stridor. No wheezing.   Abdominal:      General: Bowel sounds are normal.      Palpations: Abdomen is soft. There is no mass.      Tenderness: There is no abdominal tenderness.   Musculoskeletal:         General: No swelling.      Right lower leg: No edema.      Left lower leg: No edema.   Skin:     General: Skin is warm.      Capillary Refill: Capillary refill takes less than 2 seconds.   Neurological:      Mental Status: She is alert and oriented to person, place, and time.   Psychiatric:         Mood and Affect: Mood normal.          Assessment/Plan:    1. Health maintenance examination  To facilitate a health maintance visit .I performed a complete health  maintenance exam comprehensive examination as documented above.   Reviewed of the importance of diet and exercise in proper weight management. Patient agreed to exercise more and eat a proper diet.        2. Seasonal allergies  -Stable. Well managed on current medication. Refill provided  - montelukast (SINGULAIR) 10 MG tablet; Take 1 tablet (10 mg total) by mouth nightly  Dispense: 30 tablet; Refill: 5  - albuterol sulfate HFA (PROVENTIL) 108 (90 Base) MCG/ACT inhaler; Inhale 2 puffs into the lungs every 4 (four) hours as needed for Wheezing or Shortness of Breath  Dispense: 1 Inhaler; Refill: 5    3. Routine screening for STI (sexually transmitted infection)  - HIV Ag/Ab 4th generation  - C.Trachomatis and N.Gonorrhoeae, RNA      4. Cervical Cancer screening  - Up to day. Last Pap smear was 1 year ago by priro PCP. It was normal    5.Encounter for vaccination  -Decline flu     No follow-ups on file.    Theresia Lo Owusu-Boateng, DO

## 2019-08-03 LAB — URINE CHLAMYDIA/NEISSERIA BY PCR
Chlamydia DNA by PCR: NEGATIVE
Neisseria gonorrhoeae by PCR: NEGATIVE

## 2019-08-20 ENCOUNTER — Encounter (INDEPENDENT_AMBULATORY_CARE_PROVIDER_SITE_OTHER): Payer: Self-pay | Admitting: Family Medicine

## 2020-03-01 DIAGNOSIS — B009 Herpesviral infection, unspecified: Secondary | ICD-10-CM

## 2020-03-01 HISTORY — DX: Herpesviral infection, unspecified: B00.9

## 2020-03-14 ENCOUNTER — Other Ambulatory Visit (INDEPENDENT_AMBULATORY_CARE_PROVIDER_SITE_OTHER): Payer: Self-pay | Admitting: Family Medicine

## 2020-03-27 ENCOUNTER — Ambulatory Visit (INDEPENDENT_AMBULATORY_CARE_PROVIDER_SITE_OTHER): Payer: Commercial Managed Care - POS | Admitting: Family Medicine

## 2020-03-27 ENCOUNTER — Encounter (INDEPENDENT_AMBULATORY_CARE_PROVIDER_SITE_OTHER): Payer: Self-pay | Admitting: Family Medicine

## 2020-03-27 VITALS — BP 118/72 | HR 75 | Temp 97.0°F | Ht 66.0 in | Wt 142.0 lb

## 2020-03-27 DIAGNOSIS — J302 Other seasonal allergic rhinitis: Secondary | ICD-10-CM

## 2020-03-27 DIAGNOSIS — Z113 Encounter for screening for infections with a predominantly sexual mode of transmission: Secondary | ICD-10-CM

## 2020-03-27 MED ORDER — CETIRIZINE HCL 10 MG PO TABS
10.00 mg | ORAL_TABLET | Freq: Every day | ORAL | 1 refills | Status: AC
Start: 2020-03-27 — End: 2020-09-23

## 2020-03-27 MED ORDER — MONTELUKAST SODIUM 10 MG PO TABS
ORAL_TABLET | ORAL | 1 refills | Status: AC
Start: 2020-03-27 — End: ?

## 2020-03-27 MED ORDER — ALBUTEROL SULFATE HFA 108 (90 BASE) MCG/ACT IN AERS
2.00 | INHALATION_SPRAY | RESPIRATORY_TRACT | 5 refills | Status: AC | PRN
Start: 2020-03-27 — End: 2021-03-27

## 2020-03-27 NOTE — Progress Notes (Signed)
Have you seen any specialists/other providers since your last visit with [place]?    No    Do you agree to telemedicine visit?  No    Arm preference verified?   No    Health Maintenance Due   Topic Date Due    PAP SMEAR  Never done    HPV Series (1 - 2-dose series) Never done    DEPRESSION SCREENING  Never done

## 2020-03-27 NOTE — Progress Notes (Signed)
Subjective:      Date: 03/27/2020 8:32 AM   Patient ID: Melissa Bernard is a 24 y.o. female.    Chief Complaint:  Chief Complaint   Patient presents with   . STI RESULTS FOLLOW-UP     Pt. presents to the office to discuss about recent lab results for STDs.  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       HPI:  HPI   Presents for follow-up STD panel that showed positive for HSV-2.  Patient had questions about infection and treatment plan.     Problem List:  There is no problem list on file for this patient.      Current Medications:  Current Outpatient Medications   Medication Sig Dispense Refill   . albuterol sulfate HFA (PROVENTIL) 108 (90 Base) MCG/ACT inhaler Inhale 2 puffs into the lungs every 4 (four) hours as needed for Wheezing or Shortness of Breath 1 Inhaler 5   . cetirizine (ZyrTEC) 10 MG tablet Take 1 tablet (10 mg total) by mouth daily 90 tablet 1   . montelukast (SINGULAIR) 10 MG tablet TAKE 1 TABLET BY MOUTH EVERY DAY AT NIGHT 90 tablet 1     No current facility-administered medications for this visit.       Allergies:  Allergies   Allergen Reactions   . Pollen Extract        Past Medical History:  Past Medical History:   Diagnosis Date   . 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 Routine screening for STI (sexually transmitted infection)  -Reviewed prior STI panel that included gonorrhea, chlamydia, hepatitis, HIV and HSV.  All were negative with exception of HSV-2.  Discussed etiology of HSV-2, management, and preventative measures.  All of patient's questions were answered.  Repeat testing today at the request of patient.  - HSV Type 1 and 2 IgG    2. Seasonal allergies  -History of  seasonal allergy  -Refill medication.  - albuterol sulfate HFA (PROVENTIL) 108 (90 Base) MCG/ACT inhaler; Inhale 2 puffs into the lungs every 4 (four) hours as needed for Wheezing or Shortness of Breath  Dispense: 1 Inhaler; Refill: 5  - montelukast (SINGULAIR) 10 MG tablet; TAKE 1 TABLET BY MOUTH EVERY DAY AT NIGHT  Dispense: 90 tablet; Refill: 1  - cetirizine (ZyrTEC) 10 MG tablet; Take 1 tablet (10 mg total) by mouth daily  Dispense: 90 tablet; Refill: 1      No follow-ups on file.    Theresia Lo Owusu-Boateng, DO

## 2020-03-29 LAB — HSV TYPE 1 AND 2 ANTIBODY IGG
Herpes Antibody Type 1 IgG: NEGATIVE
Herpes Antibody Type 2 IgG: NEGATIVE

## 2020-04-01 ENCOUNTER — Other Ambulatory Visit (INDEPENDENT_AMBULATORY_CARE_PROVIDER_SITE_OTHER): Payer: Self-pay | Admitting: Family Medicine

## 2020-04-01 DIAGNOSIS — Z113 Encounter for screening for infections with a predominantly sexual mode of transmission: Secondary | ICD-10-CM

## 2020-04-01 NOTE — Progress Notes (Signed)
Hi Melissa Bernard,     HSV 1 and 2 was negative.  Given patient's prior positive results we will repeat test for third time.  Patient has been made aware.

## 2020-04-03 ENCOUNTER — Other Ambulatory Visit (FREE_STANDING_LABORATORY_FACILITY): Payer: Commercial Managed Care - POS

## 2020-04-03 DIAGNOSIS — Z113 Encounter for screening for infections with a predominantly sexual mode of transmission: Secondary | ICD-10-CM

## 2020-04-05 LAB — HSV TYPE 1 AND 2 ANTIBODY IGG
Herpes Antibody Type 1 IgG: NEGATIVE
Herpes Antibody Type 2 IgG: NEGATIVE

## 2020-04-07 NOTE — Progress Notes (Signed)
Hi Melissa Bernard,     The recent lab results show the following:   Negative Hsv1 and Hsv 2. Since you have had 2 negative test, we can call the first test a false positive. I hope this helps.    Please do not hesitate if your have any questions

## 2020-07-20 ENCOUNTER — Other Ambulatory Visit (INDEPENDENT_AMBULATORY_CARE_PROVIDER_SITE_OTHER): Payer: Self-pay | Admitting: Family Medicine

## 2020-07-20 DIAGNOSIS — J302 Other seasonal allergic rhinitis: Secondary | ICD-10-CM

## 2020-07-22 ENCOUNTER — Encounter (INDEPENDENT_AMBULATORY_CARE_PROVIDER_SITE_OTHER): Payer: Self-pay

## 2020-07-28 ENCOUNTER — Telehealth (INDEPENDENT_AMBULATORY_CARE_PROVIDER_SITE_OTHER): Payer: Commercial Managed Care - POS | Admitting: Family Medicine
